# Patient Record
Sex: Male | Born: 2005 | Race: White | Hispanic: Yes | Marital: Single | Smoking: Never smoker
Health system: Southern US, Community
[De-identification: ages and names within clinical notes are randomized; demographics above are authoritative.]

## PROBLEM LIST (undated history)

## (undated) HISTORY — PX: REPAIR CRANIAL DEFECT SIMPLE: SUR683

---
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
## 2023-01-26 ENCOUNTER — Encounter (HOSPITAL_COMMUNITY): Payer: Self-pay | Admitting: Emergency Medicine

## 2023-01-26 ENCOUNTER — Emergency Department (HOSPITAL_COMMUNITY)
Admission: EM | Admit: 2023-01-26 | Discharge: 2023-01-26 | Disposition: A | Discharge status: Home / Self Care | Payer: Medicaid Other | Attending: Emergency Medicine | Admitting: Emergency Medicine

## 2023-01-26 DIAGNOSIS — H9201 Otalgia, right ear: Secondary | ICD-10-CM | POA: Diagnosis present

## 2023-01-26 DIAGNOSIS — H60331 Swimmer's ear, right ear: Secondary | ICD-10-CM

## 2023-01-26 MED ORDER — DOXYCYCLINE HYCLATE 100 MG PO CAPS: 100.0000 mg | ORAL_CAPSULE | Freq: Two times a day (BID) | ORAL | 0 refills | Status: AC

## 2023-01-26 NOTE — ED Provider Notes (Signed)
[facility] AT [facility] Provider Note   CSN: [id number] Arrival date & time: 01/26/23  1621     History  Chief Complaint  Patient presents with   Otalgia    Cameron Khan is a 17 y.o. male.  17 y.o male with a PMH of R recurrent ear infection presents to the ED with a chief complaint of right ear pain for the past 3 days.  Patient reports a recurrent history of this, now draining some yellow drainage from the right ear, feels like his right tympanic membrane has ruptured as he has some decrease in sound, also feels like this is muffled.  He did take some Tylenol but there has not been any improvement in his symptoms.  Denies any fever, recent respiratory symptoms, no swimming in pools, no diabetes.  The history is provided by the patient.  Otalgia Associated symptoms: ear discharge   Associated symptoms: no fever        Home Medications Prior to Admission medications   Medication Sig Start Date End Date Taking? Authorizing Provider  doxycycline (VIBRAMYCIN) 100 MG capsule Take 1 capsule (100 mg total) by mouth 2 (two) times daily for 7 days. 01/26/23 02/02/23 Yes Karizma Cheek, Leonie Douglas, PA-C      Allergies    Penicillins and Shrimp [shellfish allergy]    Review of Systems   Review of Systems  Constitutional:  Negative for fever.  HENT:  Positive for ear discharge and ear pain.   Eyes:  Negative for photophobia and redness.    Physical Exam Updated Vital Signs BP 120/70 (BP Location: Right Arm)   Pulse (!) 113   Temp 98.4 F (36.9 C) (Oral)   Resp 20   Ht 5\' 10"  (1.778 m)   Wt (!) 156.5 kg   SpO2 99%   BMI 49.50 kg/m  Physical Exam Vitals and nursing note reviewed.  Constitutional:      Appearance: Normal appearance.  HENT:     Head: Normocephalic and atraumatic.     Right Ear: Tenderness present. No mastoid tenderness.     Left Ear: Hearing normal. No tenderness. No mastoid tenderness.     Ears:     Comments: Right TM with  significant yellow drainage, significant tenderness upon auscultation.  Unable to visualize tympanic membrane.  No pain behind the ear to suggest mastoiditis.    Mouth/Throat:     Mouth: Mucous membranes are moist.  Eyes:     Pupils: Pupils are equal, round, and reactive to light.  Cardiovascular:     Rate and Rhythm: Normal rate.  Pulmonary:     Effort: Pulmonary effort is normal.  Abdominal:     General: Abdomen is flat.  Musculoskeletal:     Cervical back: Normal range of motion and neck supple.  Skin:    General: Skin is warm and dry.  Neurological:     Mental Status: He is alert.     ED Results / Procedures / Treatments   Labs (all labs ordered are listed, but only abnormal results are displayed) Labs Reviewed - No data to display  EKG None  Radiology No results found.  Procedures Procedures    Medications Ordered in ED Medications - No data to display  ED Course/ Medical Decision Making/ A&P                                 Medical Decision Making  Patient with a prior history of recurrent ear infections presents to the ED with right ear drainage, yellow purulent in nature.  Denies any prior history of diabetes, according to mother he does have close PCP follow-up.  Also here you have multiple settled to his right ear.  His ears are very difficult to visualize. Left ear appears to have yellow drainage around the entire ear canal, I am unable to visualize the TM, due to his anatomy my otoscope will not reach to the end of the canal, and patient cannot tolerate this.  No pain along the mastoid to suggest further infection. Right ear appears to be close to off with extensive yellow drainage coming from the ear.  Again unable to visualize the TM, no pain along the mastoid.  He did not have any tubes placed in early childhood, he denies any fever, no recent URI symptoms, does not have an ENT.  I talked to mother at the bedside I recommended oral antibiotics as I am  not able to see the TM if there is a perforation I do not feel that patient would tolerate drops at this time as canal appears somewhat closed.  His vitals are within normal limits, he is hemodynamically stable for discharge.  Portions of this note were generated with Scientist, clinical (histocompatibility and immunogenetics). Dictation errors may occur despite best attempts at proofreading.   Final Clinical Impression(s) / ED Diagnoses Final diagnoses:  Acute swimmer's ear of right side    Rx / DC Orders ED Discharge Orders          Ordered    doxycycline (VIBRAMYCIN) 100 MG capsule  2 times daily        01/26/23 1742              Claude Manges, PA-C 01/26/23 1745    Tegeler, Canary Brim, MD 01/26/23 [phone]

## 2023-01-26 NOTE — ED Triage Notes (Signed)
Pt reports 3 days of right ear pain. Pt reports having yellow drainage and subjective fever. Pt reports hx of ruptured ear drum and infection in the same ear.

## 2023-01-26 NOTE — Discharge Instructions (Addendum)
I have prescribed antibiotics in order to help treat your infection, you will need to take 1 tablet twice a day for the next 7 days.  It is imperative that you schedule an appointment with the ear nose and throat doctor in order to further evaluate this recurrent ear infections.

## 2023-02-04 ENCOUNTER — Encounter (HOSPITAL_COMMUNITY): Payer: Self-pay | Admitting: Emergency Medicine

## 2023-02-04 ENCOUNTER — Ambulatory Visit (HOSPITAL_COMMUNITY)
Admission: EM | Admit: 2023-02-04 | Discharge: 2023-02-04 | Disposition: A | Discharge status: Home / Self Care | Payer: Medicaid Other | Attending: Internal Medicine | Admitting: Internal Medicine

## 2023-02-04 ENCOUNTER — Other Ambulatory Visit: Payer: Self-pay

## 2023-02-04 DIAGNOSIS — H6092 Unspecified otitis externa, left ear: Secondary | ICD-10-CM

## 2023-02-04 MED ORDER — NEOMYCIN-POLYMYXIN-HC 3.5-10000-1 OT SUSP: 4.0000 [drp] | Freq: Three times a day (TID) | OTIC | 0 refills | Status: DC

## 2023-02-04 NOTE — ED Triage Notes (Signed)
Pt report bilateral ear pain for a week. Seen on ED for same and PO abx given. Pt states he finished his treatment today and still on pain.

## 2023-02-04 NOTE — ED Provider Notes (Signed)
[facility]    CSN: [id number] Arrival date & time: 02/04/23  1704      History   Chief Complaint Chief Complaint  Patient presents with   Otalgia    HPI Cameron Khan is a 17 y.o. male who presents with onset of L ear pain x 3 days. Had purulent R OM one week ago and he is better from this infection since he finished doxy for this. Has not had a fever. Has appt with ENT 08/22 Pain is provoked with external ear motion.     History reviewed. No pertinent past medical history.  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 Authorizing Provider  neomycin-polymyxin-hydrocortisone (CORTISPORIN) 3.5-10000-1 OTIC suspension Place 4 drops into the left ear 3 (three) times daily. 7 days 02/04/23  Yes Rodriguez-Southworth, Nettie Elm, PA-C    Family History Family History  Problem Relation Age of Onset   Healthy Mother    Diabetes Maternal Grandmother     Social History Social History   Tobacco Use   Smoking status: Never   Smokeless tobacco: Never  Substance Use Topics   Alcohol use: Never   Drug use: Never     Allergies   Penicillins and Shrimp [shellfish allergy]   Review of Systems Review of Systems  Constitutional:  Negative for fever.  HENT:  Positive for ear pain. Negative for congestion, ear discharge and rhinorrhea.   Respiratory:  Negative for  cough.      Physical Exam Triage Vital Signs ED Triage Vitals  Encounter Vitals Group     BP 02/04/23 1716 117/78     Systolic BP Percentile --      Diastolic BP Percentile --      Pulse Rate 02/04/23 1716 83     Resp 02/04/23 1716 18     Temp 02/04/23 1716 98.3 F (36.8 C)     Temp Source 02/04/23 1716 Oral     SpO2 02/04/23 1716 96 %     Weight 02/04/23 1717 (!) 352 lb 11.8 oz (160 kg)     Height 02/04/23 1717 5\' 10"  (1.778 m)     Head Circumference --      Peak Flow --      Pain Score 02/04/23 1717 10     Pain Loc --      Pain Education --      Exclude from Growth Chart --    No data found.  Updated Vital Signs BP 117/78 (BP Location: Right Arm)   Pulse 83   Temp 98.3 F (36.8 C) (Oral)   Resp 18   Ht 5\' 10"  (1.778 m)   Wt (!) 352 lb 11.8 oz (160 kg)   SpO2 96%   BMI 50.61 kg/m   Visual Acuity Right Eye Distance:   Left Eye Distance:   Bilateral Distance:  Right Eye Near:   Left Eye Near:    Bilateral Near:     Physical Exam Vitals and nursing note reviewed.  Constitutional:      General: He is not in acute distress.    Appearance: He is obese. He is not toxic-appearing.  HENT:     Right Ear: Tympanic membrane, ear canal and external ear normal.     Left Ear: Tympanic membrane and external ear normal.     Ears:     Comments: Canal is swollen, erythematous and tender with external ear motion. There is no drainage Eyes:     General: No scleral icterus.    Conjunctiva/sclera: Conjunctivae normal.  Pulmonary:     Effort: Pulmonary effort is normal.  Musculoskeletal:        General: Normal range of motion.     Cervical back: Neck supple.  Lymphadenopathy:     Cervical: No cervical adenopathy.  Skin:    General: Skin is warm and dry.  Neurological:     Mental Status: He is alert and oriented to person, place, and time.     Gait: Gait normal.  Psychiatric:        Mood and Affect: Mood normal.        Behavior: Behavior normal.        Thought  Content: Thought content normal.        Judgment: Judgment normal.      UC Treatments / Results  Labs (all labs ordered are listed, but only abnormal results are displayed) Labs Reviewed - No data to display  EKG   Radiology No results found.  Procedures Procedures (including critical care time)  Medications Ordered in UC Medications - No data to display  Initial Impression / Assessment and Plan / UC Course  I have reviewed the triage vital signs and the nursing notes.  Pertinent labs & imaging results that were available during my care of the patient L OR  I placed him on Cortisporin otic gtts as noted Final Clinical Impressions(s) / UC Diagnoses   Final diagnoses:  Otitis externa of left ear, unspecified chronicity, unspecified type   Discharge Instructions   None    ED Prescriptions     Medication Sig Dispense Auth. Provider   neomycin-polymyxin-hydrocortisone (CORTISPORIN) 3.5-10000-1 OTIC suspension Place 4 drops into the left ear 3 (three) times daily. 7 days 10 mL Rodriguez-Southworth, Nettie Elm, PA-C      PDMP not reviewed this encounter.   Garey Ham, PA-C 02/04/23 1740

## 2023-02-13 DIAGNOSIS — H60333 Swimmer's ear, bilateral: Secondary | ICD-10-CM | POA: Diagnosis not present

## 2023-07-26 ENCOUNTER — Ambulatory Visit (HOSPITAL_COMMUNITY)
Admission: EM | Admit: 2023-07-26 | Discharge: 2023-07-26 | Disposition: A | Discharge status: Home / Self Care | Payer: Medicaid Other | Attending: Neurology | Admitting: Neurology

## 2023-07-26 ENCOUNTER — Encounter (HOSPITAL_COMMUNITY): Payer: Self-pay

## 2023-07-26 DIAGNOSIS — K529 Noninfective gastroenteritis and colitis, unspecified: Secondary | ICD-10-CM | POA: Diagnosis not present

## 2023-07-26 MED ORDER — ONDANSETRON HCL 4 MG PO TABS: 4.0000 mg | ORAL_TABLET | Freq: Four times a day (QID) | ORAL | 0 refills | Status: DC

## 2023-07-26 NOTE — ED Provider Notes (Signed)
[facility]    CSN: [id number] Arrival date & time: 07/26/23  1125      History   Chief Complaint Chief Complaint  Patient presents with   Emesis    HPI Cameron Khan is a 18 y.o. male.   Vomiting & diarrhea x 2 days. Patient has been having a fever, sore throat, SOB, nasal congestion for the last 2 weeks. The 2 days ago after eating out he was having the emesis, fever and diarrhea. Thinks he may have food poisoning.  States his little brother ate with him at [employer] and got sick as well.  Patient took a otc anti-diarrhea med with moderate relief of the diarrhea 2 days ago but then had a recurrence of his symptoms yesterday.  He did take the antidiarrhea medication again yesterday.  He is still having nausea when he eats.  He has been able to stay hydrated.     Emesis   History reviewed. No pertinent past medical history.  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 Authorizing Provider  ondansetron (ZOFRAN) 4 MG tablet Take 1 tablet (4 mg total) by mouth every 6 (six) hours. 07/26/23  Yes Elmer Picker, NP    Family History Family History  Problem Relation Age of Onset   Healthy Mother    Diabetes Maternal Grandmother     Social History Social History   Tobacco Use   Smoking status: Never   Smokeless tobacco: Never  Substance Use Topics   Alcohol  use: Never   Drug use: Never     Allergies   Penicillins and Shrimp [shellfish allergy]   Review of Systems Review of Systems  Gastrointestinal:  Positive for vomiting.     Physical Exam Triage Vital Signs ED Triage Vitals  Encounter Vitals Group     BP 07/26/23 1256 109/71     Systolic BP Percentile --      Diastolic BP Percentile --      Pulse Rate 07/26/23 1256 89     Resp 07/26/23 1256 18     Temp 07/26/23 1256 98.2 F (36.8 C)     Temp Source 07/26/23 1256 Oral     SpO2 07/26/23 1256 96 %     Weight --      Height 07/26/23 1256 5\' 11"  (1.803 m)     Head Circumference --      Peak Flow --      Pain Score 07/26/23 1255 0     Pain Loc --      Pain Education --      Exclude from Growth Chart --    No data found.  Updated Vital Signs BP 109/71 (BP Location: Left Arm)   Pulse 89   Temp 98.2 F (36.8 C) (Oral)   Resp 18   Ht 5\' 11"  (1.803 m)  SpO2 96%   BMI 49.20 kg/m   Visual Acuity Right Eye Distance:   Left Eye Distance:   Bilateral Distance:    Right Eye Near:   Left Eye Near:    Bilateral Near:     Physical Exam Vitals and nursing note reviewed.  Constitutional:      General: He is not in acute distress.    Appearance: He is well-developed.  HENT:     Head: Normocephalic and atraumatic.  Eyes:     Conjunctiva/sclera: Conjunctivae normal.  Cardiovascular:     Rate and Rhythm: Normal rate and regular rhythm.     Heart sounds: No murmur heard. Pulmonary:     Effort: Pulmonary effort is normal. No respiratory distress.     Breath sounds: Normal breath sounds.  Abdominal:     General: Bowel sounds are normal. There is no distension.     Palpations: Abdomen is soft. There is no mass.     Tenderness: There is no abdominal tenderness. There is no guarding.  Musculoskeletal:        General: No swelling.     Cervical back: Neck supple.  Skin:    General: Skin is warm and dry.     Capillary Refill: Capillary refill takes less than 2 seconds.   Neurological:     Mental Status: He is alert.  Psychiatric:        Mood and Affect: Mood normal.      UC Treatments / Results  Labs (all labs ordered are listed, but only abnormal results are displayed) Labs Reviewed - No data to display  EKG   Radiology No results found.  Procedures Procedures (including critical care time)  Medications Ordered in UC Medications - No data to display  Initial Impression / Assessment and Plan / UC Course  I have reviewed the triage vital signs and the nursing notes.  Pertinent labs & imaging results that were available during my care of the patient were reviewed by me and considered in my medical decision making (see chart for details).   Evaluation suggests viral gastrointestinal illness etiology.  Patient nontoxic appearing with hemodynamically stable vital signs, abdominal exam without peritoneal signs/focal tenderness. Will manage this with antiemetic (Zofran) as needed, OTC medicines as needed for discomfort/pain, increased fluids, and rest. Liquid/bland diet initially, then increase diet as tolerated.    Final Clinical Impressions(s) / UC Diagnoses   Final diagnoses:  Gastroenteritis     Discharge Instructions      Your evaluation suggests that your symptoms are most likely due to viral stomach illness (gastroenteritis/"stomach bug") which will improve on its own with rest and fluids in the next few days.   Take zofran to help with nausea every 8 hours as needed. You may use over the counter medicines for aches and pains such as tylenol as needed.  Start sipping on liquids (broth, water, gatorade, etc). If you are able to keep liquids down without vomiting for 1-2 hours, you may eat bland foods like jello, pudding, applesauce, bananas, rice, and white toast. Once you can tolerate blands, you may return to normal diet.   Pedialyte or gatorolyte may help to prevent/fix dehydration due to vomiting and diarrhea.  Please  follow up with your primary care provider for further management. Return if you experience worsening or uncontrolled pain, inability to tolerate fluids by mouth, difficulty breathing, fevers 100.59F or greater, recurrent vomiting, or any other concerning symptoms.        ED Prescriptions  Medication Sig Dispense Auth. Provider   ondansetron (ZOFRAN) 4 MG tablet Take 1 tablet (4 mg total) by mouth every 6 (six) hours. 12 tablet Elmer Picker, NP      PDMP not reviewed this encounter.   Elmer Picker, NP 07/26/23 1540

## 2023-07-26 NOTE — ED Triage Notes (Signed)
Vomiting & diarrhea x 2 days. Patient has been having a fever, sore throat, SOB, nasal congestion for the last 2 weeks. The 2 days ago after eating out he was having the emesis, fever and diarrhea. Thinks he may have food poisoning.  States his little brother ate with him and got sick as well.   Patient took a otc anti-diarrhea med with moderate relief of the diarrhea.

## 2023-07-26 NOTE — Discharge Instructions (Signed)

## 2023-08-18 ENCOUNTER — Encounter (HOSPITAL_COMMUNITY): Payer: Self-pay | Admitting: *Deleted

## 2023-08-18 ENCOUNTER — Ambulatory Visit (HOSPITAL_COMMUNITY)
Admission: EM | Admit: 2023-08-18 | Discharge: 2023-08-18 | Disposition: A | Discharge status: Home / Self Care | Payer: Medicaid Other | Attending: Family Medicine | Admitting: Family Medicine

## 2023-08-18 ENCOUNTER — Other Ambulatory Visit: Payer: Self-pay

## 2023-08-18 DIAGNOSIS — J069 Acute upper respiratory infection, unspecified: Secondary | ICD-10-CM | POA: Diagnosis not present

## 2023-08-18 LAB — POC COVID19/FLU A&B COMBO
Covid Antigen, POC: NEGATIVE
Influenza A Antigen, POC: NEGATIVE
Influenza B Antigen, POC: NEGATIVE

## 2023-08-18 MED ORDER — HYDROCOD POLI-CHLORPHE POLI ER 10-8 MG/5ML PO SUER: 5.0000 mL | Freq: Every evening | ORAL | 0 refills | Status: AC | PRN

## 2023-08-18 NOTE — Discharge Instructions (Signed)
 Lo atendieron hoy por sntomas de las vas respiratorias superiores.  Tu hisopado de gripe y de covid fueron negativos (pero probablemente tuviste gripe ya que la prueba de gripe de tu padre fue positiva).  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  He enviado un medicamento para la tos para ayudar con la tos y, con suerte, tambin para ayudar con el sueo.  Por favor descanse y tome lquidos.  You were seen today for upper respiratory symptoms.  You flu and covid swab were negative (but you likely had the flu as your father's flu test was positive).  Since you have had symptoms for 5 days you are outside the window for treatment.  I have sent out a cough mediation to help with cough and hopefully help with sleep as well.  Please get rest and fluids.

## 2023-08-18 NOTE — ED Triage Notes (Signed)
 Pt continues to have sore throat,vomiting ,loss of taste ,congestion and HA . These sx's are unresolved since last visit on 07-26-23.

## 2023-08-18 NOTE — ED Provider Notes (Signed)
 [facility]    CSN: [id number] Arrival date & time: 08/18/23  0825      History   Chief Complaint Chief Complaint  Patient presents with   Sore Throat   Emesis    HPI Cameron Khan is a 18 y.o. male.    Sore Throat Pertinent negatives include no shortness of breath.  Emesis Associated symptoms: arthralgias and cough    Started with fever, cough, body aches, weakness that started about 5 days ago. He started with loss of taste yesterday.  Having a hard time sleeping. He has a slight coughing.  No current wheezing or sob.  He was seen on 2/1 for food poisoning, but that resolved.  This started just 5 days ago.  He feels the worse symptoms are done, but he is having difficulty sleeping mostly.        History reviewed. No pertinent past medical history.  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 Authorizing Provider  ondansetron (ZOFRAN) 4 MG tablet Take 1 tablet (4 mg total) by mouth every 6 (six) hours. 07/26/23   Elmer Picker, NP    Family History Family History  Problem Relation Age of Onset   Healthy Mother    Diabetes Maternal Grandmother     Social History Social History   Tobacco Use   Smoking status: Never   Smokeless tobacco: Never  Substance Use Topics   Alcohol use: Never   Drug use: Never     Allergies   Penicillins and  Shrimp [shellfish allergy]   Review of Systems Review of Systems  Constitutional:  Positive for fatigue.  HENT:  Positive for congestion and rhinorrhea.   Respiratory:  Positive for cough. Negative for shortness of breath and wheezing.   Gastrointestinal:  Negative for vomiting.  Genitourinary: Negative.   Musculoskeletal:  Positive for arthralgias.  Psychiatric/Behavioral: Negative.       Physical Exam Triage Vital Signs ED Triage Vitals  Encounter Vitals Group     BP 08/18/23 0912 116/80     Systolic BP Percentile --      Diastolic BP Percentile --      Pulse Rate 08/18/23 0912 77     Resp 08/18/23 0912 18     Temp 08/18/23 0912 99 F (37.2 C)     Temp src --      SpO2 08/18/23 0912 95 %     Weight --      Height --      Head Circumference --      Peak Flow --      Pain Score 08/18/23 0910 5     Pain Loc --      Pain Education --      Exclude from Growth Chart --    No data found.  Updated Vital Signs BP 116/80   Pulse 77  Temp 99 F (37.2 C)   Resp 18   SpO2 95%   Visual Acuity Right Eye Distance:   Left Eye Distance:   Bilateral Distance:    Right Eye Near:   Left Eye Near:    Bilateral Near:     Physical Exam Constitutional:      General: He is not in acute distress.    Appearance: He is well-developed. He is not ill-appearing or toxic-appearing.  HENT:     Nose: Congestion and rhinorrhea present.     Mouth/Throat:     Mouth: Mucous membranes are moist.     Pharynx: Posterior oropharyngeal erythema present. No pharyngeal swelling or oropharyngeal exudate.  Cardiovascular:     Rate and Rhythm: Normal rate and regular rhythm.  Musculoskeletal:     Cervical back: Normal range of motion and neck supple.  Skin:    General: Skin is warm.  Neurological:     General: No focal deficit present.     Mental Status: He is alert.  Psychiatric:        Mood and Affect: Mood normal.     UC Treatments / Results  Labs (all labs ordered are listed,  but only abnormal results are displayed) Labs Reviewed  POC COVID19/FLU A&B COMBO    EKG   Radiology No results found.  Procedures Procedures (including critical care time)  Medications Ordered in UC Medications - No data to display  Initial Impression / Assessment and Plan / UC Course  I have reviewed the triage vital signs and the nursing notes.  Pertinent labs & imaging results that were available during my care of the patient were reviewed by me and considered in my medical decision making (see chart for details).  Final Clinical Impressions(s) / UC Diagnoses   Final diagnoses:  Upper respiratory tract infection, unspecified type     Discharge Instructions      Lo atendieron hoy por sntomas de las vas respiratorias superiores.  Tu hisopado de gripe y de covid fueron negativos (pero probablemente tuviste gripe ya que la prueba de gripe de tu padre fue positiva).  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  He enviado un medicamento para la tos para ayudar con la tos y, con suerte, tambin para ayudar con el sueo.  Por favor descanse y tome lquidos.  You were seen today for upper respiratory symptoms.  You flu and covid swab were negative (but you likely had the flu as your father's flu test was positive).  Since you have had symptoms for 5 days you are outside the window for treatment.  I have sent out a cough mediation to help with cough and hopefully help with sleep as well.  Please get rest and fluids.      ED Prescriptions     Medication Sig Dispense Auth. Provider   chlorpheniramine-HYDROcodone (TUSSIONEX) 10-8 MG/5ML Take 5 mLs by mouth at bedtime as needed for cough. 25 mL Jannifer Franklin, MD      PDMP not reviewed this encounter.   Jannifer Franklin, MD 08/18/23 1003

## 2023-09-10 IMAGING — CR DG CHEST 2V
2 series · 2 of 2 positions shown · non-contrast
Comparison: None Available.

CLINICAL DATA: Cough

EXAM:
CHEST - 2 VIEW

[chest lat]
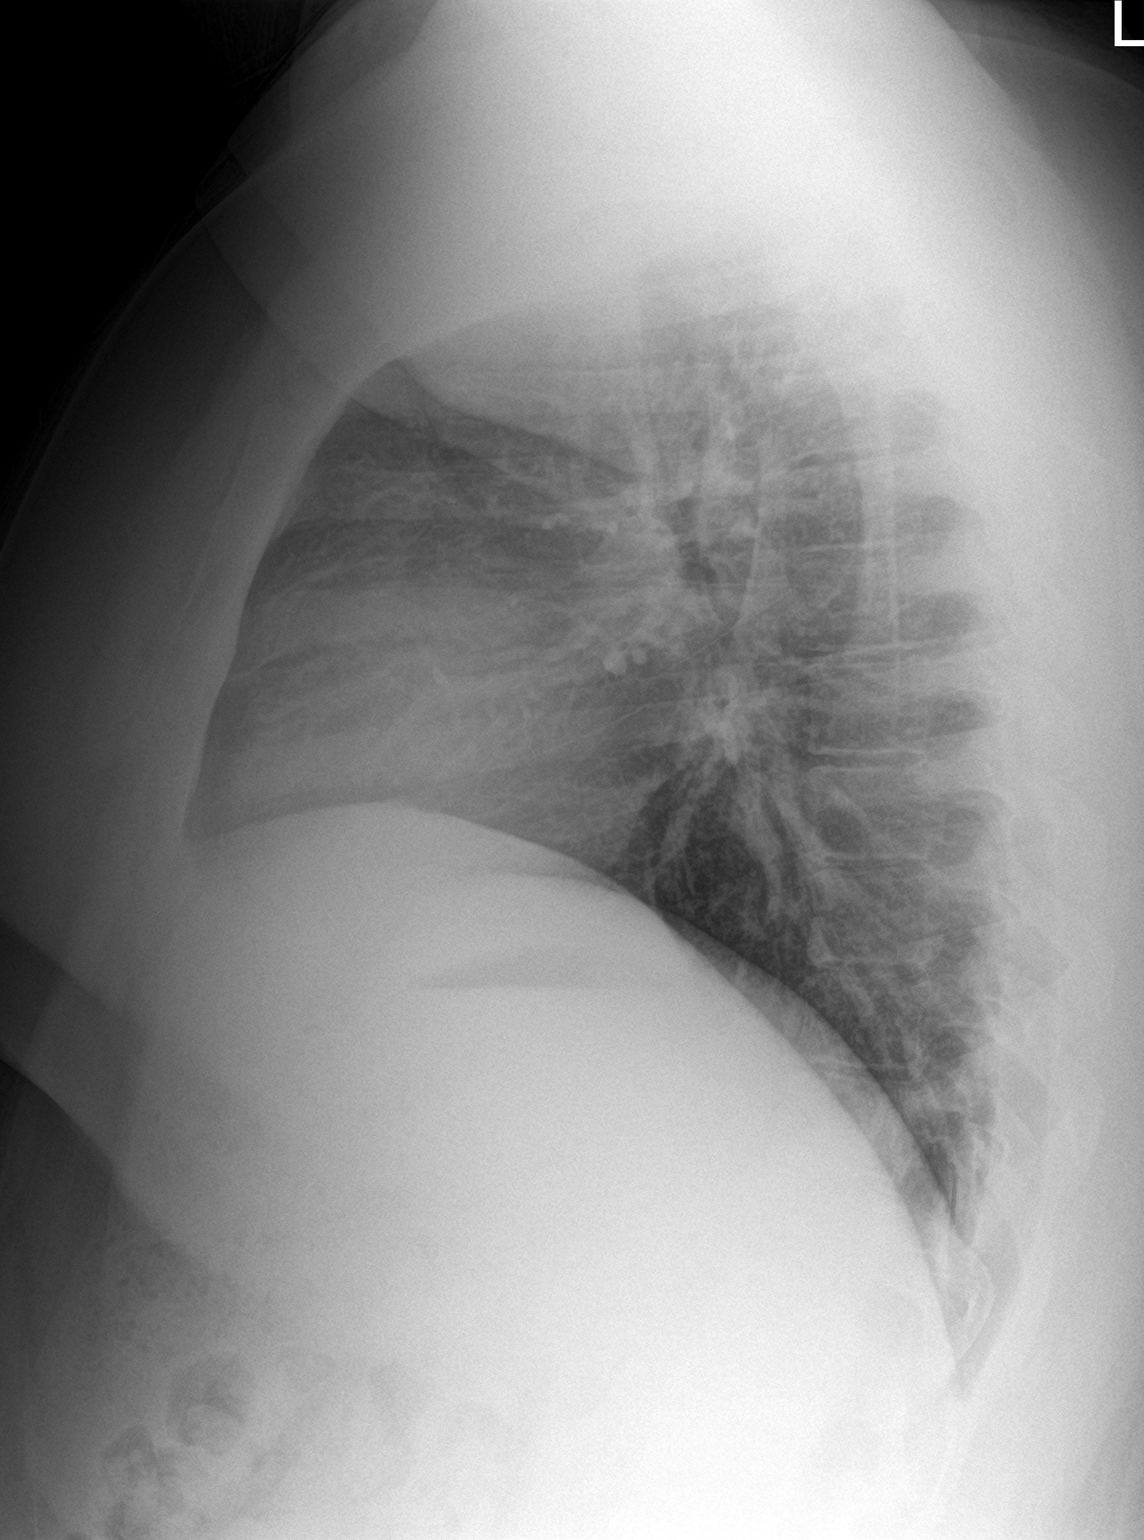

[chest pa]
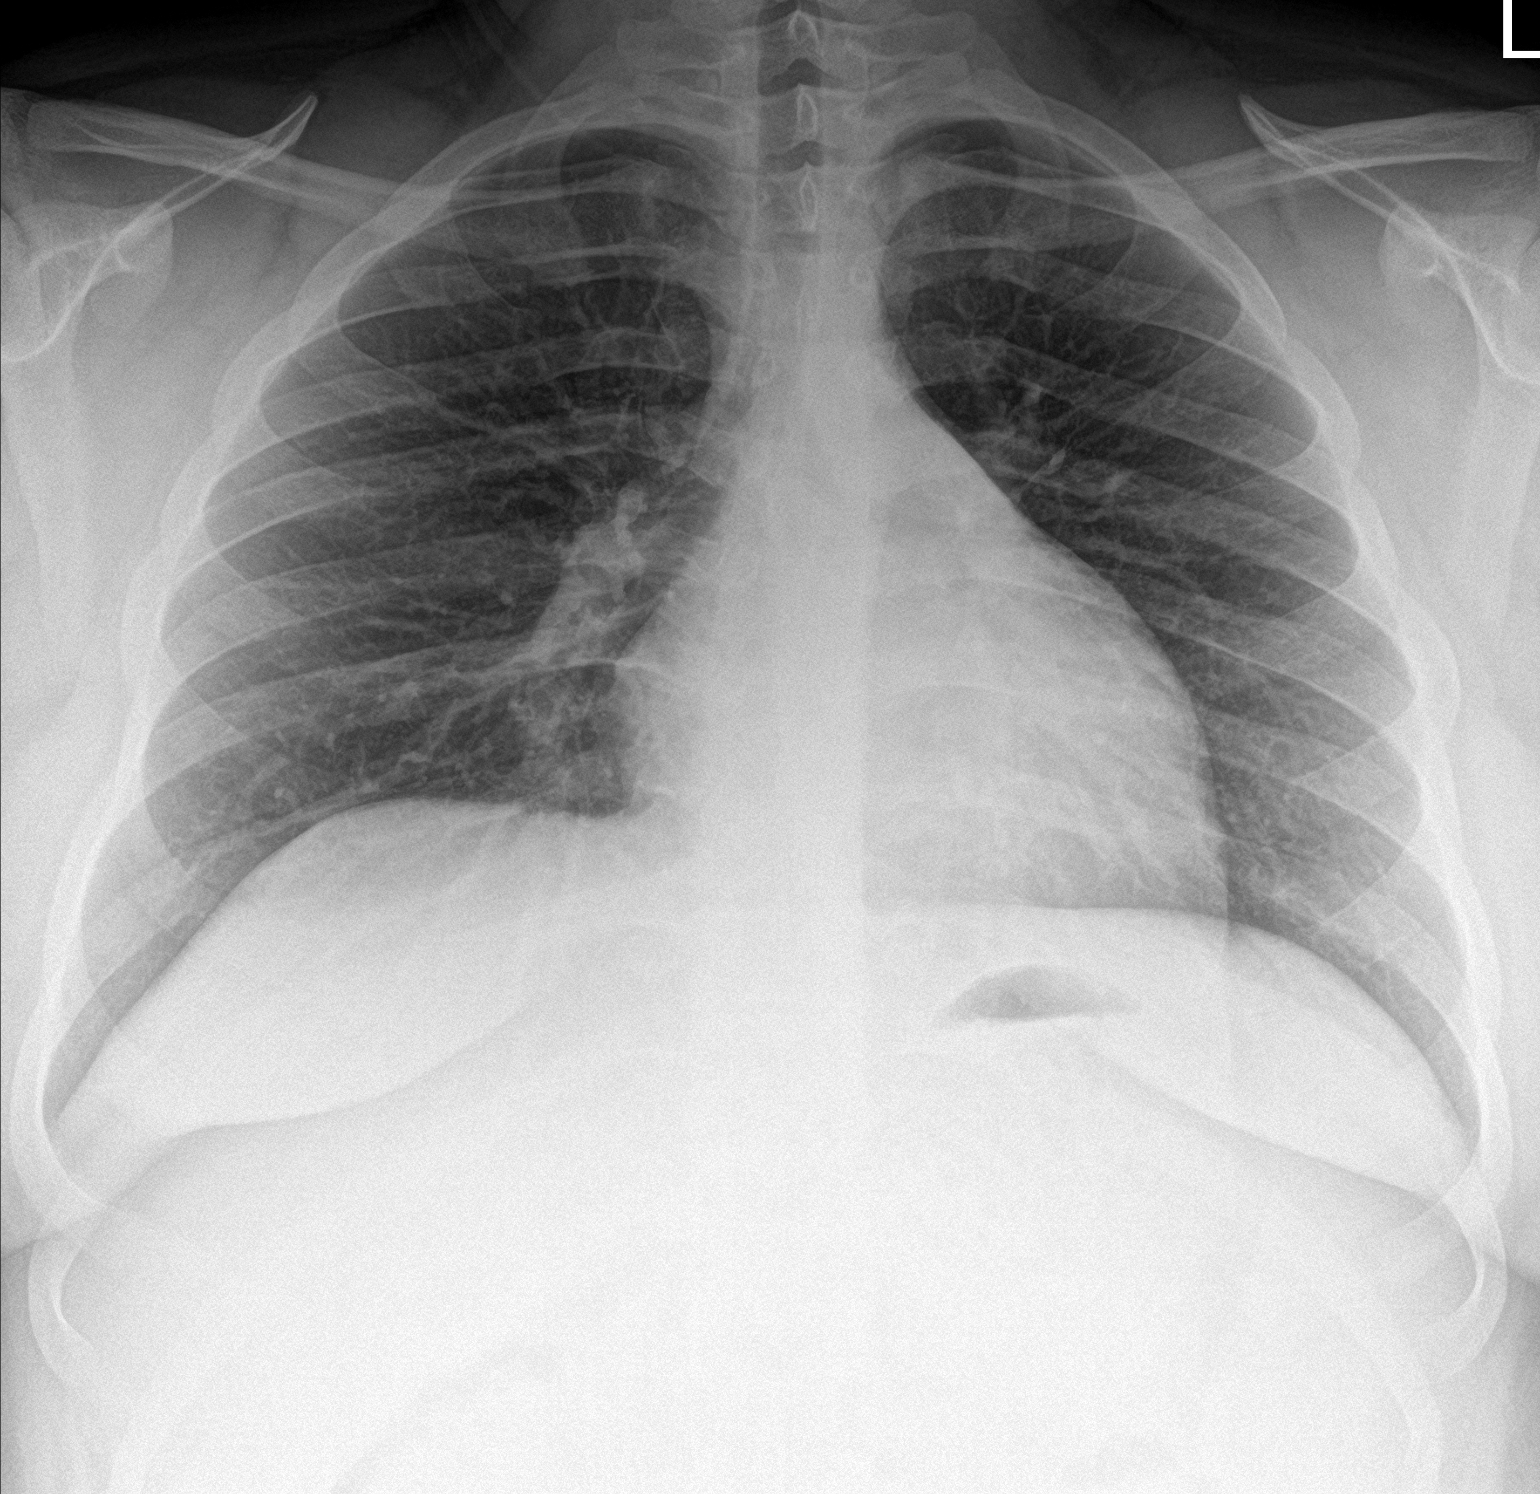

[2 of 2 positions shown; findings below may reference images not displayed]

FINDINGS: The heart size and mediastinal contours are within normal limits.
Both lungs are clear. The visualized skeletal structures are
unremarkable.
IMPRESSION: No active cardiopulmonary disease.

## 2023-12-02 ENCOUNTER — Encounter: Payer: Self-pay | Admitting: *Deleted
